# Patient Record
Sex: Male | Born: 1995 | Race: Black or African American | Hispanic: No | Marital: Single | Smoking: Never smoker
Health system: Southern US, Community
[De-identification: ages and names within clinical notes are randomized; demographics above are authoritative.]

## PROBLEM LIST (undated history)

## (undated) HISTORY — PX: INNER EAR SURGERY: SHX679

---
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
## 2017-01-15 ENCOUNTER — Ambulatory Visit (INDEPENDENT_AMBULATORY_CARE_PROVIDER_SITE_OTHER): Payer: BLUE CROSS/BLUE SHIELD | Admitting: Family

## 2017-01-15 ENCOUNTER — Encounter: Payer: Self-pay | Admitting: Family

## 2017-01-15 ENCOUNTER — Other Ambulatory Visit (INDEPENDENT_AMBULATORY_CARE_PROVIDER_SITE_OTHER): Payer: BLUE CROSS/BLUE SHIELD

## 2017-01-15 VITALS — BP 110/78 | HR 60 | Temp 98.5°F | Resp 16 | Ht 66.12 in | Wt 135.8 lb

## 2017-01-15 DIAGNOSIS — Z00129 Encounter for routine child health examination without abnormal findings: Principal | ICD-10-CM

## 2017-01-15 DIAGNOSIS — Z0000 Encounter for general adult medical examination without abnormal findings: Secondary | ICD-10-CM

## 2017-01-15 LAB — LIPID PANEL
CHOL/HDL RATIO: 3
CHOLESTEROL: 149 mg/dL (ref 0–200)
HDL: 48.9 mg/dL (ref >39.00)
LDL Cholesterol: 89 mg/dL (ref 0–99)
NonHDL: 100
TRIGLYCERIDES: 54 mg/dL (ref 0.0–149.0)
VLDL: 10.8 mg/dL (ref 0.0–40.0)

## 2017-01-15 LAB — COMPREHENSIVE METABOLIC PANEL
ALBUMIN: 4.8 g/dL (ref 3.5–5.2)
ALT: 26 U/L (ref 0–53)
AST: 19 U/L (ref 0–37)
Alkaline Phosphatase: 111 U/L (ref 52–171)
BILIRUBIN TOTAL: 0.7 mg/dL (ref 0.3–1.2)
BUN: 7 mg/dL (ref 6–23)
CALCIUM: 10.1 mg/dL (ref 8.4–10.5)
CHLORIDE: 101 meq/L (ref 96–112)
CO2: 30 meq/L (ref 19–32)
CREATININE: 1.01 mg/dL (ref 0.40–1.50)
GFR: 122.91 mL/min (ref >60.00)
Glucose, Bld: 86 mg/dL (ref 70–99)
Potassium: 4 mEq/L (ref 3.5–5.1)
Sodium: 138 mEq/L (ref 135–145)
Total Protein: 7.8 g/dL (ref 6.0–8.3)

## 2017-01-15 LAB — CBC
HCT: 45.7 % (ref 36.0–49.0)
HEMOGLOBIN: 14.9 g/dL (ref 12.0–16.0)
MCHC: 32.7 g/dL (ref 31.0–37.0)
MCV: 83.8 fl (ref 78.0–98.0)
PLATELETS: 363 10*3/uL (ref 150.0–575.0)
RBC: 5.45 Mil/uL (ref 3.80–5.70)
RDW: 13.2 % (ref 11.4–15.5)
WBC: 3.6 10*3/uL — AB (ref 4.5–13.5)

## 2017-01-15 NOTE — Patient Instructions (Addendum)
Thank you for choosing [facility].  SUMMARY AND INSTRUCTIONS:  Good luck in school!  Labs:  Please stop by the lab on the lower level of the building for your blood work. Your results will be released to MyChart (or called to you) after review, usually within 72 hours after test completion. If any changes need to be made, you will be notified at that same time.  1.) The lab is open from 7:30am to 5:30 pm Monday-Friday 2.) No appointment is necessary 3.) Fasting (if needed) is 6-8 hours after food and drink; black coffee and water are okay   Follow up:  If your symptoms worsen or fail to improve, please contact our office for further instruction, or in case of emergency go directly to the emergency room at the closest medical facility.    Health Maintenance, Male A healthy lifestyle and preventive care is important for your health and wellness. Ask your health care provider about what schedule of regular examinations is right for you. What should I know about weight and diet? Eat a Healthy Diet  Eat plenty of vegetables, fruits, whole grains, low-fat dairy products, and lean protein.  Do not eat a lot of foods high in solid fats, added sugars, or salt.  Maintain a Healthy Weight Regular exercise can help you achieve or maintain a healthy weight. You should:  Do at least 150 minutes of exercise each week. The exercise should increase your heart rate and make you sweat (moderate-intensity exercise).  Do strength-training exercises at least twice a week.  Watch Your Levels of Cholesterol and Blood Lipids  Have your blood tested for lipids and cholesterol every 5 years starting at 19 years of age. If you are at high risk for heart disease, you should start having your blood tested when you are 19 years old. You may need to have your cholesterol levels checked more often if: ? Your lipid or cholesterol levels are high. ? You are older than 19 years of age. ? You are at high  risk for heart disease.  What should I know about cancer screening? Many types of cancers can be detected early and may often be prevented. Lung Cancer  You should be screened every year for lung cancer if: ? You are a current smoker who has smoked for at least 30 years. ? You are a former smoker who has quit within the past 15 years.  Talk to your health care provider about your screening options, when you should start screening, and how often you should be screened.  Colorectal Cancer  Routine colorectal cancer screening usually begins at 19 years of age and should be repeated every 5-10 years until you are 19 years old. You may need to be screened more often if early forms of precancerous polyps or small growths are found. Your health care provider may recommend screening at an earlier age if you have risk factors for colon cancer.  Your health care provider may recommend using home test kits to check for hidden blood in the stool.  A small camera at the end of a tube can be used to examine your colon (sigmoidoscopy or colonoscopy). This checks for the earliest forms of colorectal cancer.  Prostate and Testicular Cancer  Depending on your age and overall health, your health care provider may do certain tests to screen for prostate and testicular cancer.  Talk to your health care provider about any symptoms or concerns you have about testicular or prostate cancer.  Skin Cancer  Check your skin from head to toe regularly.  Tell your health care provider about any new moles or changes in moles, especially if: ? There is a change in a mole's size, shape, or color. ? You have a mole that is larger than a pencil eraser.  Always use sunscreen. Apply sunscreen liberally and repeat throughout the day.  Protect yourself by wearing long sleeves, pants, a wide-brimmed hat, and sunglasses when outside.  What should I know about heart disease, diabetes, and high blood pressure?  If you  are 5318-19 years of age, have your blood pressure checked every 3-5 years. If you are 240 years of age or older, have your blood pressure checked every year. You should have your blood pressure measured twice-once when you are at a hospital or clinic, and once when you are not at a hospital or clinic. Record the average of the two measurements. To check your blood pressure when you are not at a hospital or clinic, you can use: ? An automated blood pressure machine at a pharmacy. ? A home blood pressure monitor.  Talk to your health care provider about your target blood pressure.  If you are between 7045-19 years old, ask your health care provider if you should take aspirin to prevent heart disease.  Have regular diabetes screenings by checking your fasting blood sugar level. ? If you are at a normal weight and have a low risk for diabetes, have this test once every three years after the age of 19. ? If you are overweight and have a high risk for diabetes, consider being tested at a younger age or more often.  A one-time screening for abdominal aortic aneurysm (AAA) by ultrasound is recommended for men aged 65-75 years who are current or former smokers. What should I know about preventing infection? Hepatitis B If you have a higher risk for hepatitis B, you should be screened for this virus. Talk with your health care provider to find out if you are at risk for hepatitis B infection. Hepatitis C Blood testing is recommended for:  Everyone born from [birth year] through 1963.  Anyone with known risk factors for hepatitis C.  Sexually Transmitted Diseases (STDs)  You should be screened each year for STDs including gonorrhea and chlamydia if: ? You are sexually active and are younger than 19 years of age. ? You are older than 19 years of age and your health care provider tells you that you are at risk for this type of infection. ? Your sexual activity has changed since you were last screened and you are  at an increased risk for chlamydia or gonorrhea. Ask your health care provider if you are at risk.  Talk with your health care provider about whether you are at high risk of being infected with HIV. Your health care provider may recommend a prescription medicine to help prevent HIV infection.  What else can I do?  Schedule regular health, dental, and eye exams.  Stay current with your vaccines (immunizations).  Do not use any tobacco products, such as cigarettes, chewing tobacco, and e-cigarettes. If you need help quitting, ask your health care provider.  Limit alcohol intake to no more than 2 drinks per day. One drink equals 12 ounces of beer, 5 ounces of wine, or 1 ounces of hard liquor.  Do not use street drugs.  Do not share needles.  Ask your health care provider for help if you need support or information about quitting  drugs.  Tell your health care provider if you often feel depressed.  Tell your health care provider if you have ever been abused or do not feel safe at home. This information is not intended to replace advice given to you by your health care provider. Make sure you discuss any questions you have with your health care provider. Document Released: 12/02/2007 Document Revised: 02/02/2016 Document Reviewed: 03/09/2015 Elsevier Interactive Patient Education  [employer].

## 2017-01-15 NOTE — Progress Notes (Signed)
Subjective:    Patient ID: Adam Matthews, male    DOB: [birth year], 21 y.o.   MRN: [id number]  Chief Complaint  Patient presents with  . CPE    fasting    HPI:  Adam Matthews is a 21 y.o. male who presents today for an annual wellness visit.   1) Health Maintenance -   Diet - Averages about 2-3 meals with snacks in between consisting of regular diet; 1-2 cups of caffeine every other day.   Exercise - Plays soccer weekly;   2) Preventative Exams / Immunizations:  Dental -- Due for exam   Vision -- Due for exam    Health Maintenance  Topic Date Due  . HIV Screening  06/10/2013  . INFLUENZA VACCINE  01/17/2017    Immunization History  Administered Date(s) Administered  . DTaP 08/11/1998, 10/20/1998, 12/22/1998, 01/13/2000, 03/04/2004  . HPV 9-valent 09/04/2014, 12/17/2015  . HPV Quadrivalent 06/22/2014  . Hepatitis B 08/11/1998, 07/11/1999, 01/25/2000  . HiB (PRP-OMP) 08/11/1998, 10/20/1998, 01/13/2000  . IPV 08/11/1998, 12/22/1998, 09/20/1999, 03/04/2004  . Influenza Split 04/03/2012  . Influenza,inj,Quad PF,36+ Mos 03/19/2014, 06/02/2015, 06/06/2016  . Influenza-Unspecified 05/04/2004  . MMR 09/20/1999, 08/05/2014  . Meningococcal B, OMV 12/17/2015  . Meningococcal Conjugate 06/22/2014  . Tdap 04/18/2010, 06/23/2011  . Varicella 08/05/2014, 09/04/2014     No Known Allergies   No outpatient prescriptions prior to visit.   No facility-administered medications prior to visit.      Past Medical History:  Diagnosis Date  . Asthma      Past Surgical History:  Procedure Laterality Date  . INNER EAR SURGERY       Family History  Problem Relation Age of Onset  . Healthy Mother   . Diabetes Father   . Breast cancer Maternal Grandmother   . Dementia Maternal Grandfather      Social History   Social History  . Marital status: Single    Spouse name: N/A  . Number of children: 0  . Years of education: 12   Occupational History  .  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 Smoking status: Never Smoker  . Smokeless tobacco: Never Used  . Alcohol use No  . Drug use: No  . Sexual activity: Not on file   Other Topics Concern  . Not on file   Social History Narrative   Fun: Video games      Review of Systems  Constitutional: Denies fever, chills, fatigue, or significant weight gain/loss. HENT: Head: Denies headache or neck pain Ears: Denies changes in hearing, ringing in ears, earache, drainage Nose: Denies discharge, stuffiness, itching, nosebleed, sinus pain Throat: Denies sore throat, hoarseness, dry mouth, sores, thrush Eyes: Denies loss/changes in vision, pain, redness, blurry/double vision, flashing lights Cardiovascular: Denies chest pain/discomfort, tightness, palpitations, shortness of breath with activity, difficulty lying down, swelling, sudden awakening with shortness of breath Respiratory: Denies shortness of breath, cough, sputum production, wheezing Gastrointestinal: Denies dysphasia, heartburn, change in appetite, nausea, change in bowel habits, rectal bleeding, constipation, diarrhea, yellow skin or eyes Genitourinary: Denies frequency, urgency, burning/pain, blood in urine, incontinence, change in urinary strength. Musculoskeletal: Denies muscle/joint pain, stiffness, back pain, redness or swelling of joints, trauma Skin: Denies rashes, lumps, itching, dryness, color changes, or hair/nail changes Neurological: Denies dizziness, fainting, seizures, weakness, numbness, tingling, tremor Psychiatric - Denies nervousness, stress, depression or memory loss Endocrine: Denies heat or cold intolerance, sweating, frequent urination, excessive thirst,  changes in appetite Hematologic: Denies ease of bruising or bleeding     Objective:     BP 110/78 (BP Location: Left Arm, Patient Position: Sitting, Cuff Size: Normal)   Pulse 60   Temp 98.5 F (36.9 C) (Oral)    Resp 16   Ht 5' 6.12" (1.679 m)   Wt 135 lb 12.8 oz (61.6 kg)   SpO2 97%   BMI 21.84 kg/m  Nursing note and vital signs reviewed.  Physical Exam  Constitutional: He is oriented to person, place, and time. He appears well-developed and well-nourished.  HENT:  Head: Normocephalic.  Right Ear: Hearing, tympanic membrane, external ear and ear canal normal.  Left Ear: Hearing, tympanic membrane, external ear and ear canal normal.  Nose: Nose normal.  Mouth/Throat: Uvula is midline, oropharynx is clear and moist and mucous membranes are normal.  Eyes: Pupils are equal, round, and reactive to light. Conjunctivae and EOM are normal.  Neck: Neck supple. No JVD present. No tracheal deviation present. No thyromegaly present.  Cardiovascular: Normal rate, regular rhythm, normal heart sounds and intact distal pulses.   Pulmonary/Chest: Effort normal and breath sounds normal.  Abdominal: Soft. Bowel sounds are normal. He exhibits no distension and no mass. There is no tenderness. There is no rebound and no guarding.  Musculoskeletal: Normal range of motion. He exhibits no edema or tenderness.  Lymphadenopathy:    He has no cervical adenopathy.  Neurological: He is alert and oriented to person, place, and time. He has normal reflexes. No cranial nerve deficit. He exhibits normal muscle tone. Coordination normal.  Skin: Skin is warm and dry.  Psychiatric: He has a normal mood and affect. His behavior is normal. Judgment and thought content normal.       Assessment & Plan:   Problem List Items Addressed This Visit      Other   Wellness examination - Primary    1) Anticipatory Guidance: Discussed importance of wearing a seatbelt while driving and not texting while driving; changing batteries in smoke detector at least once annually; wearing suntan lotion when outside; eating a balanced and moderate diet; getting physical activity at least 30 minutes per day.  2) Immunizations / Screenings /  Labs:  All immunizations are up-to-date per recommendations. Due for a dental and vision screen encouraged to be completed independently. All other screenings are up-to-date per recommendations. Obtain CBC, CMET, and lipid profile.    Overall well exam with minimal risk factors for cardiovascular disease and adequate growth and development. Heading to college in the fall and discussed continued nutritional intake that is moderate, balance, and varied as well as continued physical activity and promotion of overall health. Continue healthy lifestyle behaviors and choices. Follow-up prevention exam in 1 year. Follow-up office visit pending blood work as needed.         Follow-up: Return in about 1 year (around 01/15/2018), or if symptoms worsen or fail to improve.   Mauricio Po, FNP

## 2017-01-15 NOTE — Assessment & Plan Note (Signed)
1) Anticipatory Guidance: Discussed importance of wearing a seatbelt while driving and not texting while driving; changing batteries in smoke detector at least once annually; wearing suntan lotion when outside; eating a balanced and moderate diet; getting physical activity at least 30 minutes per day.  2) Immunizations / Screenings / Labs:  All immunizations are up-to-date per recommendations. Due for a dental and vision screen encouraged to be completed independently. All other screenings are up-to-date per recommendations. Obtain CBC, CMET, and lipid profile.    Overall well exam with minimal risk factors for cardiovascular disease and adequate growth and development. Heading to college in the fall and discussed continued nutritional intake that is moderate, balance, and varied as well as continued physical activity and promotion of overall health. Continue healthy lifestyle behaviors and choices. Follow-up prevention exam in 1 year. Follow-up office visit pending blood work as needed.
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
## 2019-04-24 ENCOUNTER — Telehealth: Payer: Self-pay | Admitting: Emergency Medicine

## 2019-04-24 NOTE — Telephone Encounter (Signed)
Pt needs to transfer care from [facility]. His dad is a patient of your and is wondering if you would be will to except him as a patient as well. Please advise thanks.

## 2019-04-24 NOTE — Telephone Encounter (Signed)
Ok with me, thanks.

## 2019-04-30 NOTE — Telephone Encounter (Signed)
Left pt vm to call back to schedule. 

## 2019-05-07 DIAGNOSIS — Z23 Encounter for immunization: Secondary | ICD-10-CM | POA: Diagnosis not present

## 2019-05-07 DIAGNOSIS — Z7184 Encounter for health counseling related to travel: Secondary | ICD-10-CM | POA: Diagnosis not present

## 2021-02-02 ENCOUNTER — Other Ambulatory Visit: Payer: Self-pay

## 2021-02-02 ENCOUNTER — Encounter (HOSPITAL_COMMUNITY): Payer: Self-pay

## 2021-02-02 ENCOUNTER — Ambulatory Visit (HOSPITAL_COMMUNITY)
Admission: EM | Admit: 2021-02-02 | Discharge: 2021-02-02 | Disposition: A | Discharge status: Home / Self Care | Payer: BC Managed Care – PPO | Attending: Student | Admitting: Student

## 2021-02-02 DIAGNOSIS — R509 Fever, unspecified: Secondary | ICD-10-CM

## 2021-02-02 MED ORDER — PROMETHAZINE-DM 6.25-15 MG/5ML PO SYRP: 5.0000 mL | ORAL_SOLUTION | Freq: Four times a day (QID) | ORAL | 0 refills | Status: AC | PRN

## 2021-02-02 NOTE — Discharge Instructions (Addendum)
-  Promethazine DM cough syrup for congestion/cough. This could make you drowsy, so take at night before bed. -For fevers/chills, bodyaches, headaches- -You can take Tylenol up to 1000 mg 3 times daily, and ibuprofen up to 800 mg 3 times daily with food.  You can take these together, or alternate every 3-4 hours.

## 2021-02-02 NOTE — ED Triage Notes (Signed)
Pt reports nasal congestion and sinus pressure x 4 days.

## 2021-02-02 NOTE — ED Provider Notes (Signed)
[facility]    CSN: [id number] Arrival date & time: 02/02/21  1908      History   Chief Complaint Chief Complaint  Patient presents with  . Nasal Congestion    HPI Adam Matthews is a 25 y.o. male presenting with viral symptoms x4 days. Medical history asthma controlled on no medications.  Notes nasal congestion, mild sore throat, nonproductive cough, subjective chills, occasional throbbing headache.  HPI  Past Medical History:  Diagnosis Date  . Asthma     Patient Active Problem List   Diagnosis Date Noted  . Moderate episode of recurrent major depressive disorder (HCC) 07/04/2016  . Wellness examination 12/17/2015  . Hearing loss due to cerumen impaction 06/22/2014  . Tinea versicolor 12/11/2013  . Allergic rhinitis 10/22/2012  . Sinusitis, acute maxillary 09/09/2012    Past Surgical History:  Procedure Laterality Date  . INNER EAR SURGERY         Home Medications    Prior to Admission medications   Medication Sig Start Date End Date Taking? Authorizing Provider  promethazine-dextromethorphan (PROMETHAZINE-DM) 6.25-15 MG/5ML syrup Take 5 mLs by mouth 4 (four) times daily as needed for cough. 02/02/21  Yes Rhys Martini, PA-C    Family History Family History  Problem Relation Age of Onset  . Healthy Mother   . Diabetes Father   . Breast cancer Maternal Grandmother   . Dementia Maternal Grandfather     Social History Social History   Tobacco Use  . Smoking status: Never  . Smokeless tobacco: Never  Vaping Use  . Vaping Use: Never used  Substance Use Topics  . Alcohol use: No  . Drug use: No    Types: Marijuana     Allergies   Patient has no known allergies.   Review of Systems Review of Systems  Constitutional:  Positive for chills. Negative for appetite change and fever.  HENT:  Positive for congestion and sore throat. Negative for ear pain, rhinorrhea, sinus pressure and sinus pain.   Eyes:  Negative for redness and  visual disturbance.  Respiratory:  Positive for cough. Negative for chest tightness, shortness of breath and wheezing.   Cardiovascular:  Negative for chest pain and palpitations.  Gastrointestinal:  Negative for abdominal pain, constipation, diarrhea, nausea and vomiting.  Genitourinary:  Negative for dysuria, frequency and urgency.  Musculoskeletal:  Negative for myalgias.  Neurological:  Negative for dizziness, weakness and headaches.  Psychiatric/Behavioral:  Negative for confusion.   All other systems reviewed and are negative.   Physical Exam Triage Vital Signs ED Triage Vitals  Enc Vitals Group     BP      Pulse      Resp      Temp      Temp src      SpO2      Weight      Height      Head Circumference      Peak Flow      Pain Score      Pain Loc      Pain Edu?      Excl. in GC?    No data found.  Updated Vital Signs BP 104/67 (BP Location: Right Arm)   Pulse (!) 103   Temp 99.6 F (37.6 C) (Oral)   Resp 18   SpO2 95%   Visual Acuity Right Eye Distance:   Left Eye Distance:   Bilateral Distance:    Right Eye Near:   Left Eye  Near:    Bilateral Near:     Physical Exam Vitals reviewed.  Constitutional:      General: He is not in acute distress.    Appearance: Normal appearance. He is not ill-appearing.  HENT:     Head: Normocephalic and atraumatic.     Right Ear: Tympanic membrane, ear canal and external ear normal. No tenderness. No middle ear effusion. There is no impacted cerumen. Tympanic membrane is not perforated, erythematous, retracted or bulging.     Left Ear: Tympanic membrane, ear canal and external ear normal. No tenderness.  No middle ear effusion. There is no impacted cerumen. Tympanic membrane is not perforated, erythematous, retracted or bulging.     Nose: Nose normal. No congestion.     Mouth/Throat:     Mouth: Mucous membranes are moist.     Pharynx: Uvula midline. Posterior oropharyngeal erythema present. No oropharyngeal exudate.      Comments: On exam, uvula is midline, she is tolerating her secretions without difficulty, there is no trismus, no drooling, she has normal phonation  Eyes:     Extraocular Movements: Extraocular movements intact.     Pupils: Pupils are equal, round, and reactive to light.  Cardiovascular:     Rate and Rhythm: Normal rate and regular rhythm.     Heart sounds: Normal heart sounds.  Pulmonary:     Effort: Pulmonary effort is normal.     Breath sounds: Normal breath sounds. No decreased breath sounds, wheezing, rhonchi or rales.  Abdominal:     Palpations: Abdomen is soft.     Tenderness: There is no abdominal tenderness. There is no guarding or rebound.  Lymphadenopathy:     Cervical: No cervical adenopathy.     Right cervical: No superficial cervical adenopathy.    Left cervical: No superficial cervical adenopathy.  Neurological:     General: No focal deficit present.     Mental Status: He is alert and oriented to person, place, and time.  Psychiatric:        Mood and Affect: Mood normal.        Behavior: Behavior normal.        Thought Content: Thought content normal.        Judgment: Judgment normal.     UC Treatments / Results  Labs (all labs ordered are listed, but only abnormal results are displayed) Labs Reviewed - No data to display  EKG   Radiology No results found.  Procedures Procedures (including critical care time)  Medications Ordered in UC Medications - No data to display  Initial Impression / Assessment and Plan / UC Course  I have reviewed the triage vital signs and the nursing notes.  Pertinent labs & imaging results that were available during my care of the patient were reviewed by me and considered in my medical decision making (see chart for details).     This patient is a very pleasant 25 y.o. year old male presenting with viral URI.    Tachycardic 103 Borderline febrile Centor score 0, rapid strep deferred Negative home covid test on  day 4 of symptoms, declines PCR today  Promethazine DM for symptomatic relief.  ED return precautions discussed. Patient verbalizes understanding and agreement.   Coding Level 4 for acute illness with systemic symptoms, and prescription drug management  Final Clinical Impressions(s) / UC Diagnoses   Final diagnoses:  Febrile illness     Discharge Instructions      -Promethazine DM cough syrup for congestion/cough. This could  make you drowsy, so take at night before bed. -For fevers/chills, bodyaches, headaches- -You can take Tylenol up to 1000 mg 3 times daily, and ibuprofen up to 800 mg 3 times daily with food.  You can take these together, or alternate every 3-4 hours.      ED Prescriptions     Medication Sig Dispense Auth. Provider   promethazine-dextromethorphan (PROMETHAZINE-DM) 6.25-15 MG/5ML syrup Take 5 mLs by mouth 4 (four) times daily as needed for cough. 118 mL Rhys Martini, PA-C      PDMP not reviewed this encounter.   Rhys Martini, PA-C 02/02/21 2016
